# Patient Record
Sex: Female | Born: 1960 | Race: White | Hispanic: No | Marital: Married | Smoking: Never smoker
Health system: Southern US, Community
[De-identification: ages and names within clinical notes are randomized; demographics above are authoritative.]

## PROBLEM LIST (undated history)

## (undated) HISTORY — PX: ABDOMINAL HYSTERECTOMY: SHX81

---

## 2005-10-15 ENCOUNTER — Ambulatory Visit: Payer: Self-pay

## 2005-10-27 ENCOUNTER — Ambulatory Visit: Payer: Self-pay | Admitting: Internal Medicine

## 2006-03-13 ENCOUNTER — Emergency Department: Payer: Self-pay | Admitting: Internal Medicine

## 2006-10-13 ENCOUNTER — Ambulatory Visit: Payer: Self-pay | Admitting: Internal Medicine

## 2007-10-12 ENCOUNTER — Emergency Department: Payer: Self-pay | Admitting: Emergency Medicine

## 2007-11-21 ENCOUNTER — Ambulatory Visit: Payer: Self-pay | Admitting: Internal Medicine

## 2008-12-13 ENCOUNTER — Ambulatory Visit: Payer: Self-pay | Admitting: Family Medicine

## 2010-02-06 ENCOUNTER — Ambulatory Visit: Payer: Self-pay | Admitting: Family Medicine

## 2011-02-11 ENCOUNTER — Ambulatory Visit: Payer: Self-pay | Admitting: Family Medicine

## 2012-02-12 ENCOUNTER — Ambulatory Visit: Payer: Self-pay | Admitting: Family Medicine

## 2013-02-28 ENCOUNTER — Ambulatory Visit: Payer: Self-pay

## 2014-04-03 ENCOUNTER — Ambulatory Visit: Payer: Self-pay | Admitting: Family Medicine

## 2014-11-09 ENCOUNTER — Other Ambulatory Visit: Payer: Self-pay | Admitting: Family Medicine

## 2014-11-09 DIAGNOSIS — Z1231 Encounter for screening mammogram for malignant neoplasm of breast: Secondary | ICD-10-CM

## 2015-04-10 ENCOUNTER — Ambulatory Visit: Admission: RE | Admit: 2015-04-10 | Payer: Managed Care, Other (non HMO) | Source: Ambulatory Visit

## 2015-04-10 ENCOUNTER — Ambulatory Visit: Payer: Managed Care, Other (non HMO) | Attending: Family Medicine

## 2015-06-06 ENCOUNTER — Ambulatory Visit
Admission: RE | Admit: 2015-06-06 | Discharge: 2015-06-06 | Disposition: A | Discharge status: Home / Self Care | Payer: Managed Care, Other (non HMO) | Source: Ambulatory Visit | Attending: Family Medicine | Admitting: Family Medicine

## 2015-06-06 DIAGNOSIS — Z1231 Encounter for screening mammogram for malignant neoplasm of breast: Secondary | ICD-10-CM

## 2016-07-01 ENCOUNTER — Other Ambulatory Visit: Payer: Self-pay | Admitting: Family Medicine

## 2016-07-01 DIAGNOSIS — Z1231 Encounter for screening mammogram for malignant neoplasm of breast: Secondary | ICD-10-CM

## 2016-07-10 ENCOUNTER — Ambulatory Visit
Admission: RE | Admit: 2016-07-10 | Discharge: 2016-07-10 | Disposition: A | Discharge status: Home / Self Care | Payer: Managed Care, Other (non HMO) | Source: Ambulatory Visit | Attending: Family Medicine | Admitting: Family Medicine

## 2016-07-10 DIAGNOSIS — Z1231 Encounter for screening mammogram for malignant neoplasm of breast: Secondary | ICD-10-CM | POA: Diagnosis not present

## 2017-06-28 ENCOUNTER — Other Ambulatory Visit: Payer: Self-pay | Admitting: Family Medicine

## 2017-06-28 DIAGNOSIS — Z1231 Encounter for screening mammogram for malignant neoplasm of breast: Secondary | ICD-10-CM

## 2017-07-12 ENCOUNTER — Ambulatory Visit
Admission: RE | Admit: 2017-07-12 | Discharge: 2017-07-12 | Disposition: A | Discharge status: Home / Self Care | Payer: Managed Care, Other (non HMO) | Source: Ambulatory Visit | Attending: Family Medicine | Admitting: Family Medicine

## 2017-07-12 ENCOUNTER — Encounter (INDEPENDENT_AMBULATORY_CARE_PROVIDER_SITE_OTHER): Payer: Self-pay

## 2017-07-12 DIAGNOSIS — Z1231 Encounter for screening mammogram for malignant neoplasm of breast: Secondary | ICD-10-CM | POA: Diagnosis not present

## 2017-09-05 ENCOUNTER — Other Ambulatory Visit: Payer: Self-pay

## 2017-09-05 ENCOUNTER — Ambulatory Visit
Admission: EM | Admit: 2017-09-05 | Discharge: 2017-09-05 | Disposition: A | Discharge status: Home / Self Care | Payer: Managed Care, Other (non HMO) | Attending: Family Medicine | Admitting: Family Medicine

## 2017-09-05 ENCOUNTER — Encounter: Payer: Self-pay | Admitting: Gynecology

## 2017-09-05 DIAGNOSIS — T7840XA Allergy, unspecified, initial encounter: Secondary | ICD-10-CM | POA: Diagnosis not present

## 2017-09-05 DIAGNOSIS — L500 Allergic urticaria: Secondary | ICD-10-CM

## 2017-09-05 MED ORDER — EPINEPHRINE 0.3 MG/0.3ML IJ SOAJ: 0.3000 mg | Freq: Once | INTRAMUSCULAR | 0 refills | Status: AC

## 2017-09-05 MED ORDER — DIPHENHYDRAMINE HCL 50 MG PO CAPS
50.0000 mg | ORAL_CAPSULE | Freq: Four times a day (QID) | ORAL | Status: DC | PRN
  Administered 2017-09-05: 50 mg via ORAL

## 2017-09-05 MED ORDER — BESIFLOXACIN HCL 0.6 % OP SUSP: 1.0000 [drp] | Freq: Two times a day (BID) | OPHTHALMIC | 0 refills | Status: DC

## 2017-09-05 NOTE — ED Provider Notes (Signed)
[facility]    CSN: [id number] Arrival date & time: 09/05/17  1125     History   Chief Complaint Chief Complaint  Patient presents with  . Allergic Reaction    HPI Michelle Russell is a 57 y.o. female.   The history is provided by the patient.  Allergic Reaction  Presenting symptoms: itching and rash   Presenting symptoms: no difficulty breathing, no difficulty swallowing, no swelling and no wheezing   Severity:  Mild Duration:  1 hour Prior allergic episodes:  Allergies to medications Context: medications (started using new prescription eye drops several days ago)   Context: not animal exposure, not chemicals, not cosmetics, not dairy/milk products, not eggs, not food allergies, not grass, not insect bite/sting, not jewelry/metal, not new detergents/soaps, not nuts and not poison ivy   Relieved by:  None tried Ineffective treatments:  None tried   History reviewed. No pertinent past medical history.  There are no active problems to display for this patient.   Past Surgical History:  Procedure Laterality Date  . ABDOMINAL HYSTERECTOMY      OB History    No data available       Home Medications    Prior to Admission medications   Medication Sig Start Date End Date Taking? Authorizing Provider  levothyroxine (SYNTHROID, LEVOTHROID) 175 MCG tablet Take 175 mcg by mouth daily before breakfast.   Yes [provider]  EPINEPHrine 0.3 mg/0.3 mL IJ SOAJ injection Inject 0.3 mLs (0.3 mg total) into the muscle once for 1 dose. 09/05/17 09/05/17  Payton Mccallumonty, Janiel Crisostomo, MD    Family History Family History  Problem Relation Age of Onset  . Breast cancer Neg Hx     Social History Social History   Tobacco Use  . Smoking status: Not on file  Substance Use Topics  . Alcohol use: Not on file  . Drug use: Not on file     Allergies   Lidocaine   Review of Systems Review of Systems  HENT: Negative for trouble swallowing.   Respiratory: Negative  for wheezing.   Skin: Positive for itching and rash.     Physical Exam Triage Vital Signs ED Triage Vitals  Enc Vitals Group     BP 09/05/17 1137 (!) 172/86     Pulse Rate 09/05/17 1137 77     Resp 09/05/17 1137 16     Temp 09/05/17 1137 (!) 97.4 F (36.3 C)     Temp Source 09/05/17 1137 Oral     SpO2 09/05/17 1137 100 %     Weight --      Height --      Head Circumference --      Peak Flow --      Pain Score 09/05/17 1138 0     Pain Loc --      Pain Edu? --      Excl. in GC? --    No data found.  Updated Vital Signs BP 121/78 (BP Location: Right Arm)   Pulse 65   Temp 98.2 F (36.8 C) (Oral)   Resp 16   Ht 5\' 8"  (1.727 m)   Wt 230 lb (104.3 kg)   SpO2 97%   BMI 34.97 kg/m   Visual Acuity Right Eye Distance:   Left Eye Distance:   Bilateral Distance:    Right Eye Near:   Left Eye Near:    Bilateral Near:     Physical Exam  Constitutional: She appears well-developed and well-nourished.  No distress.  HENT:  Head: Normocephalic and atraumatic.  Right Ear: External ear normal.  Left Ear: External ear normal.  Nose: No mucosal edema, rhinorrhea, nose lacerations, sinus tenderness, nasal deformity, septal deviation or nasal septal hematoma. No epistaxis.  No foreign bodies. Right sinus exhibits no maxillary sinus tenderness and no frontal sinus tenderness. Left sinus exhibits no maxillary sinus tenderness and no frontal sinus tenderness.  Mouth/Throat: Uvula is midline, oropharynx is clear and moist and mucous membranes are normal. No oropharyngeal exudate, posterior oropharyngeal edema, posterior oropharyngeal erythema or tonsillar abscesses. No tonsillar exudate.  Neck: Normal range of motion. Neck supple. No tracheal deviation present. No thyromegaly present.  Cardiovascular: Normal rate, regular rhythm and normal heart sounds.  Pulmonary/Chest: Effort normal and breath sounds normal. No stridor. No respiratory distress. She has no wheezes. She has no rales.    Lymphadenopathy:    She has no cervical adenopathy.  Skin: Rash (face, upper chest and upper back) noted. Rash is urticarial. She is not diaphoretic. There is erythema.  Nursing note and vitals reviewed.    UC Treatments / Results  Labs (all labs ordered are listed, but only abnormal results are displayed) Labs Reviewed - No data to display  EKG  EKG Interpretation None       Radiology No results found.  Procedures Procedures (including critical care time)  Medications Ordered in UC Medications  diphenhydrAMINE (BENADRYL) capsule 50 mg (50 mg Oral Given 09/05/17 1150)     Initial Impression / Assessment and Plan / UC Course  I have reviewed the triage vital signs and the nursing notes.  Pertinent labs & imaging results that were available during my care of the patient were reviewed by me and considered in my medical decision making (see chart for details).       Final Clinical Impressions(s) / UC Diagnoses   Final diagnoses:  Allergic reaction, initial encounter    ED Discharge Orders        Ordered    Besifloxacin HCl (BESIVANCE) 0.6 % SUSP  2 times daily,   Status:  Discontinued     09/05/17 1216    EPINEPHrine 0.3 mg/0.3 mL IJ SOAJ injection   Once     09/05/17 1241     1. diagnosis reviewed with patient 2. Given Benadryl 50mg  po x 1 with resolution of symptoms 3. rx as per orders above (Besivance not prescribed; entered in error) ; reviewed possible side effects, interactions, risks and benefits  4. Recommend follow up with ophthalmologist and PCP regarding allergic reaction  5. Follow-up prn if symptoms worsen or don't improve  Controlled Substance Prescriptions [place] Controlled Substance Registry consulted? Not Applicable   Payton Mccallum, MD 09/05/17 1255

## 2017-09-05 NOTE — ED Triage Notes (Signed)
Per patient having allergic reaction to her eye drops that she start taking x yesterday. Per patient is schedule for eye surgery on 09/07/2017 and was given these eye drops to take prior to surgery. Pt. Rx. Besivance 0.6 sup, 1 drop bilateral eyes 4 times a day and Ilevro 0.3% 1 drop left eye daily.

## 2018-06-10 ENCOUNTER — Other Ambulatory Visit: Payer: Self-pay | Admitting: Family Medicine

## 2018-06-10 DIAGNOSIS — Z1231 Encounter for screening mammogram for malignant neoplasm of breast: Secondary | ICD-10-CM

## 2018-07-13 ENCOUNTER — Ambulatory Visit
Admission: RE | Admit: 2018-07-13 | Discharge: 2018-07-13 | Disposition: A | Discharge status: Home / Self Care | Payer: Managed Care, Other (non HMO) | Source: Ambulatory Visit | Attending: Family Medicine | Admitting: Family Medicine

## 2018-07-13 ENCOUNTER — Encounter (INDEPENDENT_AMBULATORY_CARE_PROVIDER_SITE_OTHER): Payer: Self-pay

## 2018-07-13 DIAGNOSIS — Z1231 Encounter for screening mammogram for malignant neoplasm of breast: Secondary | ICD-10-CM | POA: Insufficient documentation

## 2018-10-21 ENCOUNTER — Encounter: Payer: Self-pay | Admitting: Emergency Medicine

## 2018-10-21 ENCOUNTER — Other Ambulatory Visit: Payer: Self-pay

## 2018-10-21 ENCOUNTER — Ambulatory Visit
Admission: EM | Admit: 2018-10-21 | Discharge: 2018-10-21 | Disposition: A | Discharge status: Home / Self Care | Payer: Managed Care, Other (non HMO) | Attending: Family Medicine | Admitting: Family Medicine

## 2018-10-21 ENCOUNTER — Ambulatory Visit (INDEPENDENT_AMBULATORY_CARE_PROVIDER_SITE_OTHER): Payer: Managed Care, Other (non HMO)

## 2018-10-21 DIAGNOSIS — R109 Unspecified abdominal pain: Secondary | ICD-10-CM

## 2018-10-21 DIAGNOSIS — R1031 Right lower quadrant pain: Secondary | ICD-10-CM

## 2018-10-21 LAB — COMPREHENSIVE METABOLIC PANEL
ALT: 30 U/L (ref 0–44)
AST: 34 U/L (ref 15–41)
Albumin: 4.6 g/dL (ref 3.5–5.0)
Alkaline Phosphatase: 93 U/L (ref 38–126)
Anion gap: 8 (ref 5–15)
BILIRUBIN TOTAL: 0.7 mg/dL (ref 0.3–1.2)
BUN: 17 mg/dL (ref 6–20)
CALCIUM: 9 mg/dL (ref 8.9–10.3)
CO2: 26 mmol/L (ref 22–32)
CREATININE: 0.87 mg/dL (ref 0.44–1.00)
Chloride: 103 mmol/L (ref 98–111)
GFR calc non Af Amer: >60 mL/min (ref >60)
Glucose, Bld: 103 mg/dL — ABNORMAL HIGH (ref 70–99)
Potassium: 4.1 mmol/L (ref 3.5–5.1)
Sodium: 137 mmol/L (ref 135–145)
Total Protein: 8.1 g/dL (ref 6.5–8.1)

## 2018-10-21 LAB — CBC WITH DIFFERENTIAL/PLATELET
Abs Immature Granulocytes: 0.02 10*3/uL (ref 0.00–0.07)
Basophils Absolute: 0 10*3/uL (ref 0.0–0.1)
Basophils Relative: 1 %
EOS PCT: 2 %
Eosinophils Absolute: 0.1 10*3/uL (ref 0.0–0.5)
HCT: 41.6 % (ref 36.0–46.0)
HEMOGLOBIN: 13.4 g/dL (ref 12.0–15.0)
Immature Granulocytes: 0 %
LYMPHS PCT: 35 %
Lymphs Abs: 1.8 10*3/uL (ref 0.7–4.0)
MCH: 29.4 pg (ref 26.0–34.0)
MCHC: 32.2 g/dL (ref 30.0–36.0)
MCV: 91.2 fL (ref 80.0–100.0)
MONO ABS: 0.4 10*3/uL (ref 0.1–1.0)
Monocytes Relative: 8 %
Neutro Abs: 2.7 10*3/uL (ref 1.7–7.7)
Neutrophils Relative %: 54 %
Platelets: 176 10*3/uL (ref 150–400)
RBC: 4.56 MIL/uL (ref 3.87–5.11)
RDW: 12.8 % (ref 11.5–15.5)
WBC: 5 10*3/uL (ref 4.0–10.5)
nRBC: 0 % (ref 0.0–0.2)

## 2018-10-21 LAB — LIPASE, BLOOD: LIPASE: 35 U/L (ref 11–51)

## 2018-10-21 LAB — URINALYSIS, COMPLETE (UACMP) WITH MICROSCOPIC
BACTERIA UA: NONE SEEN
Bilirubin Urine: NEGATIVE
Glucose, UA: NEGATIVE mg/dL
Ketones, ur: NEGATIVE mg/dL
Leukocytes,Ua: NEGATIVE
Nitrite: NEGATIVE
Protein, ur: NEGATIVE mg/dL
SPECIFIC GRAVITY, URINE: 1.02 (ref 1.005–1.030)
WBC UA: NONE SEEN WBC/hpf (ref 0–5)
pH: 5 (ref 5.0–8.0)

## 2018-10-21 MED ORDER — HYDROCODONE-ACETAMINOPHEN 5-325 MG PO TABS: ORAL_TABLET | ORAL | 0 refills | Status: AC

## 2018-10-21 MED ORDER — KETOROLAC TROMETHAMINE 10 MG PO TABS: 10.0000 mg | ORAL_TABLET | Freq: Three times a day (TID) | ORAL | 0 refills | Status: AC | PRN

## 2018-10-21 MED ORDER — KETOROLAC TROMETHAMINE 60 MG/2ML IM SOLN
60.0000 mg | Freq: Once | INTRAMUSCULAR | Status: AC
  Administered 2018-10-21: 60 mg via INTRAMUSCULAR

## 2018-10-21 MED ORDER — TAMSULOSIN HCL 0.4 MG PO CAPS: 0.4000 mg | ORAL_CAPSULE | Freq: Every day | ORAL | 0 refills | Status: AC

## 2018-10-21 NOTE — Discharge Instructions (Addendum)
Increase water intake

## 2018-10-21 NOTE — ED Provider Notes (Signed)
[facility]    CSN: [id number] Arrival date & time: 10/21/18  2482     History   Chief Complaint Chief Complaint  Patient presents with  . Back Pain    HPI Hanadi Galano is a 58 y.o. female.   58 yo female with a c/o low back pain and spasms for the past week. Denies any fevers, chills, nausea/vomiting, bowel problems.   The history is provided by the patient.  Back Pain    History reviewed. No pertinent past medical history.  There are no active problems to display for this patient.   Past Surgical History:  Procedure Laterality Date  . ABDOMINAL HYSTERECTOMY      OB History   No obstetric history on file.      Home Medications    Prior to Admission medications   Medication Sig Start Date End Date Taking? Authorizing Provider  levothyroxine (SYNTHROID, LEVOTHROID) 175 MCG tablet Take 175 mcg by mouth daily before breakfast.   Yes [provider]  HYDROcodone-acetaminophen (NORCO/VICODIN) 5-325 MG tablet 1-2 tabs po q 6 hours prn 10/21/18   Payton Mccallum, MD  ketorolac (TORADOL) 10 MG tablet Take 1 tablet (10 mg total) by mouth every 8 (eight) hours as needed. 10/21/18   Payton Mccallum, MD  tamsulosin (FLOMAX) 0.4 MG CAPS capsule Take 1 capsule (0.4 mg total) by mouth daily. 10/21/18   Payton Mccallum, MD    Family History Family History  Problem Relation Age of Onset  . Hypertension Father   . Breast cancer Neg Hx     Social History Social History   Tobacco Use  . Smoking status: Never Smoker  . Smokeless tobacco: Never Used  Substance Use Topics  . Alcohol use: Not Currently  . Drug use: Never     Allergies   Adhesive [tape]; Clindamycin/lincomycin; Gentamicin; Levaquin [levofloxacin]; Systane [polyethyl glycol-propyl glycol]; and Lidocaine   Review of Systems Review of Systems  Musculoskeletal: Positive for back pain.     Physical Exam Triage Vital Signs ED Triage Vitals  Enc Vitals Group     BP 10/21/18 0841  (!) 153/92     Pulse Rate 10/21/18 0841 82     Resp 10/21/18 0841 16     Temp 10/21/18 0841 98.1 F (36.7 C)     Temp Source 10/21/18 0841 Oral     SpO2 10/21/18 0841 97 %     Weight 10/21/18 0836 240 lb (108.9 kg)     Height 10/21/18 0836 5\' 8"  (1.727 m)     Head Circumference --      Peak Flow --      Pain Score 10/21/18 0836 10     Pain Loc --      Pain Edu? --      Excl. in GC? --    No data found.  Updated Vital Signs BP (!) 153/92 (BP Location: Left Arm)   Pulse 82   Temp 98.1 F (36.7 C) (Oral)   Resp 16   Ht 5\' 8"  (1.727 m)   Wt 108.9 kg   SpO2 97%   BMI 36.49 kg/m   Visual Acuity Right Eye Distance:   Left Eye Distance:   Bilateral Distance:    Right Eye Near:   Left Eye Near:    Bilateral Near:     Physical Exam Vitals signs and nursing note reviewed.  Constitutional:      General: She is not in acute distress.    Appearance: She is  well-developed. She is not toxic-appearing or diaphoretic.  Abdominal:     General: Bowel sounds are normal. There is no distension.     Palpations: Abdomen is soft. There is no mass.     Tenderness: There is abdominal tenderness. There is right CVA tenderness (mild). There is no left CVA tenderness, guarding or rebound.     Hernia: No hernia is present.  Neurological:     Mental Status: She is alert.      UC Treatments / Results  Labs (all labs ordered are listed, but only abnormal results are displayed) Labs Reviewed  COMPREHENSIVE METABOLIC PANEL - Abnormal; Notable for the following components:      Result Value   Glucose, Bld 103 (*)    All other components within normal limits  URINALYSIS, COMPLETE (UACMP) WITH MICROSCOPIC - Abnormal; Notable for the following components:   Hgb urine dipstick TRACE (*)    All other components within normal limits  LIPASE, BLOOD  CBC WITH DIFFERENTIAL/PLATELET    EKG None  Radiology No results found.  Procedures Procedures (including critical care time)   Medications Ordered in UC Medications  ketorolac (TORADOL) injection 60 mg (60 mg Intramuscular Given 10/21/18 0900)    Initial Impression / Assessment and Plan / UC Course  I have reviewed the triage vital signs and the nursing notes.  Pertinent labs & imaging results that were available during my care of the patient were reviewed by me and considered in my medical decision making (see chart for details).      Final Clinical Impressions(s) / UC Diagnoses   Final diagnoses:  Acute right flank pain     Discharge Instructions     Increase water intake     ED Prescriptions    Medication Sig Dispense Auth. Provider   ketorolac (TORADOL) 10 MG tablet Take 1 tablet (10 mg total) by mouth every 8 (eight) hours as needed. 15 tablet Payton Mccallum, MD   tamsulosin (FLOMAX) 0.4 MG CAPS capsule Take 1 capsule (0.4 mg total) by mouth daily. 7 capsule Payton Mccallum, MD   HYDROcodone-acetaminophen (NORCO/VICODIN) 5-325 MG tablet 1-2 tabs po q 6 hours prn 6 tablet Payton Mccallum, MD     1. Labs/x-ray results and diagnosis reviewed with patient 2. rx as per orders above; reviewed possible side effects, interactions, risks and benefits  3. Recommend supportive treatment as above 4. Follow-up prn if symptoms worsen or don't improve  Controlled Substance Prescriptions [place] Controlled Substance Registry consulted? Not Applicable   Payton Mccallum, MD 11/11/18 1750

## 2018-10-21 NOTE — ED Triage Notes (Signed)
Patient c/o lower back pain and spasms that started a week ago.  Patient reports being constipated.  Patient reports last bowel movement was last night and states stools not hard.  Patient denies N/V.  Patient denies fever or chills.  Patient denies travel outside of [place] in the past 14 days.

## 2020-01-19 ENCOUNTER — Other Ambulatory Visit: Payer: Self-pay | Admitting: Student

## 2020-01-19 DIAGNOSIS — Z1231 Encounter for screening mammogram for malignant neoplasm of breast: Secondary | ICD-10-CM

## 2020-01-24 ENCOUNTER — Other Ambulatory Visit: Payer: Self-pay

## 2020-01-24 ENCOUNTER — Ambulatory Visit
Admission: RE | Admit: 2020-01-24 | Discharge: 2020-01-24 | Disposition: A | Discharge status: Home / Self Care | Payer: Managed Care, Other (non HMO) | Source: Ambulatory Visit | Attending: Student | Admitting: Student

## 2020-01-24 DIAGNOSIS — Z1231 Encounter for screening mammogram for malignant neoplasm of breast: Secondary | ICD-10-CM | POA: Diagnosis not present

## 2020-02-15 IMAGING — CR ABDOMEN - 2 VIEW
3 series · 3 of 3 positions shown · non-contrast
Comparison: None.

CLINICAL DATA: Abdominal pain

EXAM:
ABDOMEN - 2 VIEW

[abdomen erect]
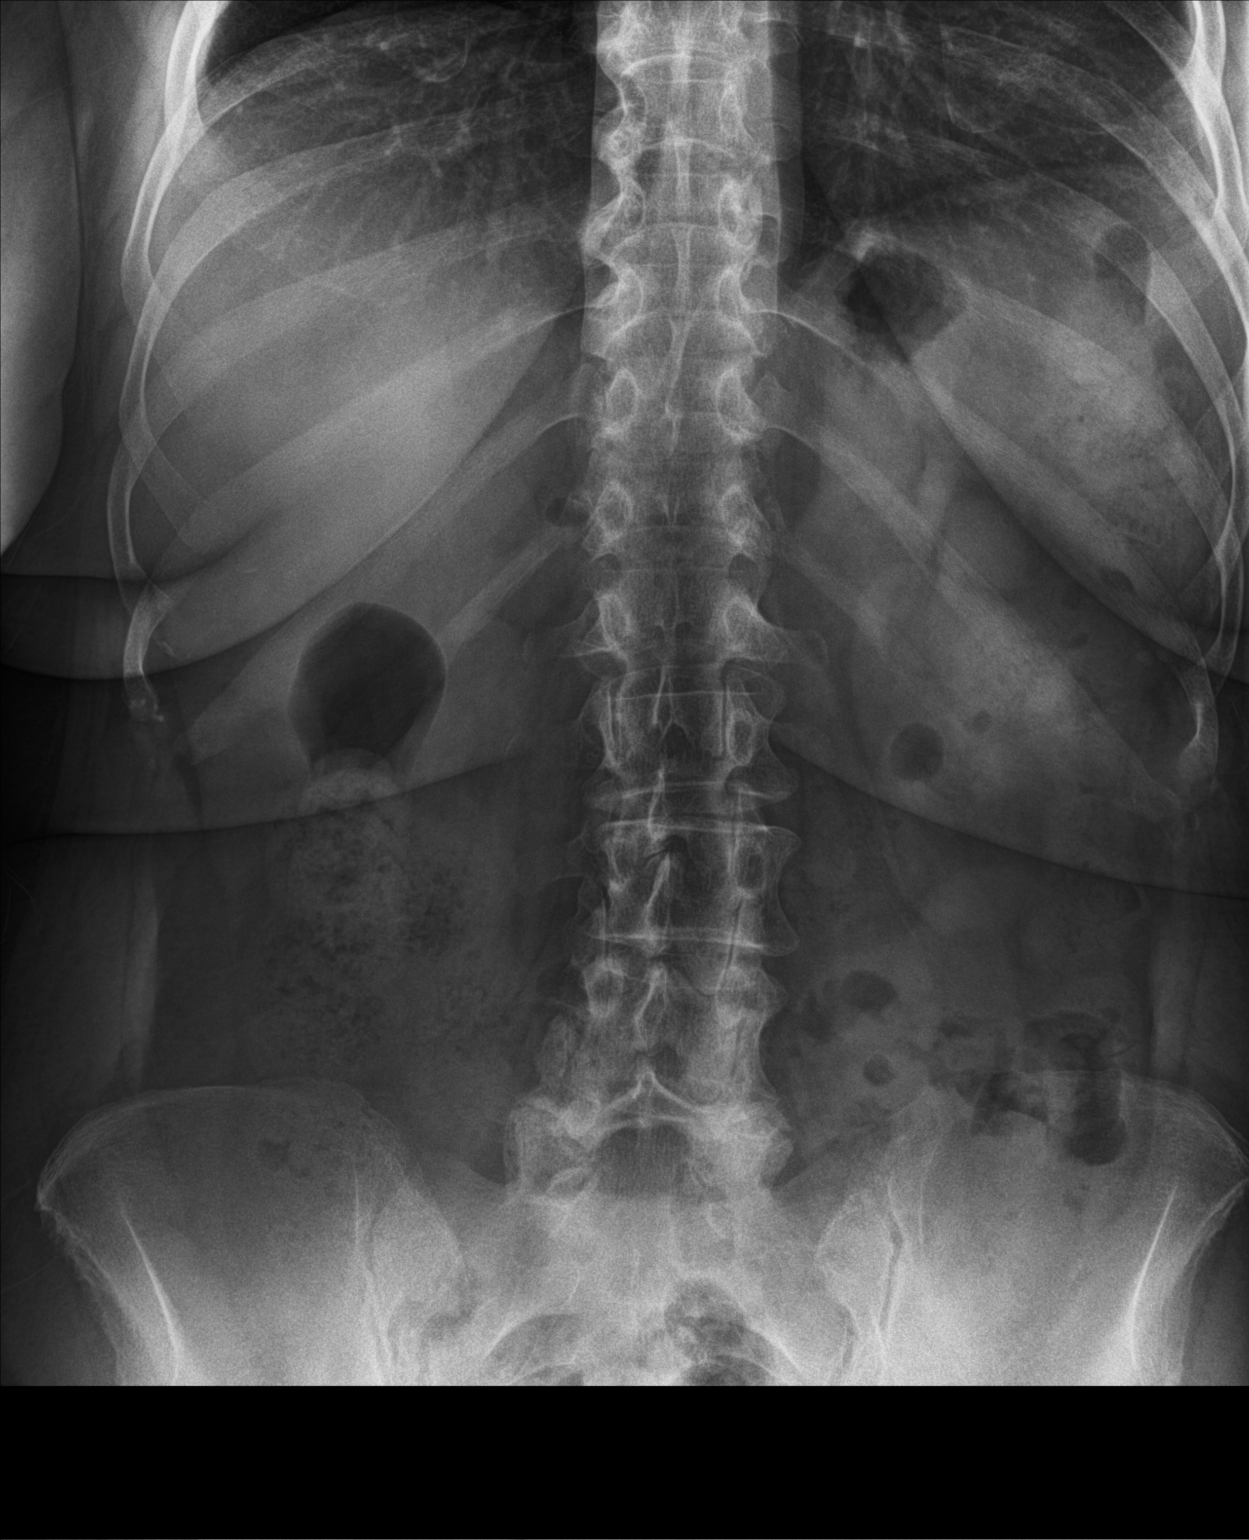

[abdomen supine (1 of 2)]
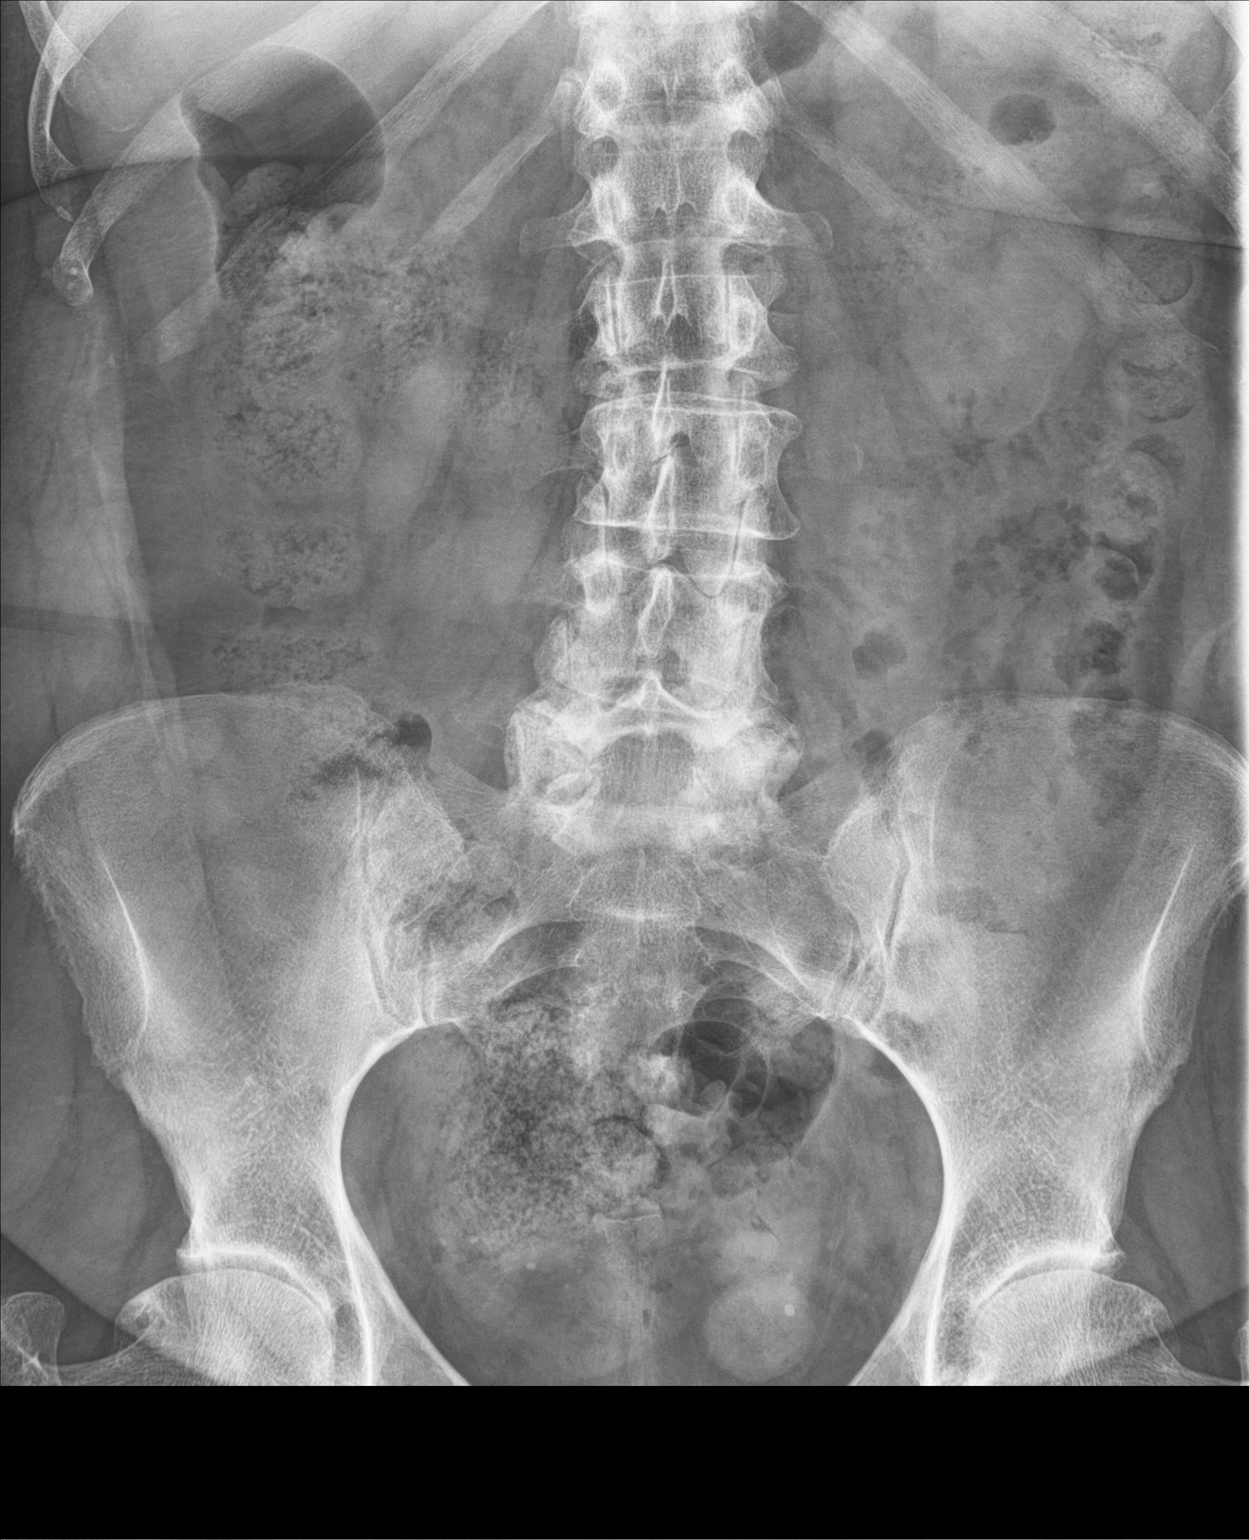

[abdomen supine (2 of 2)]
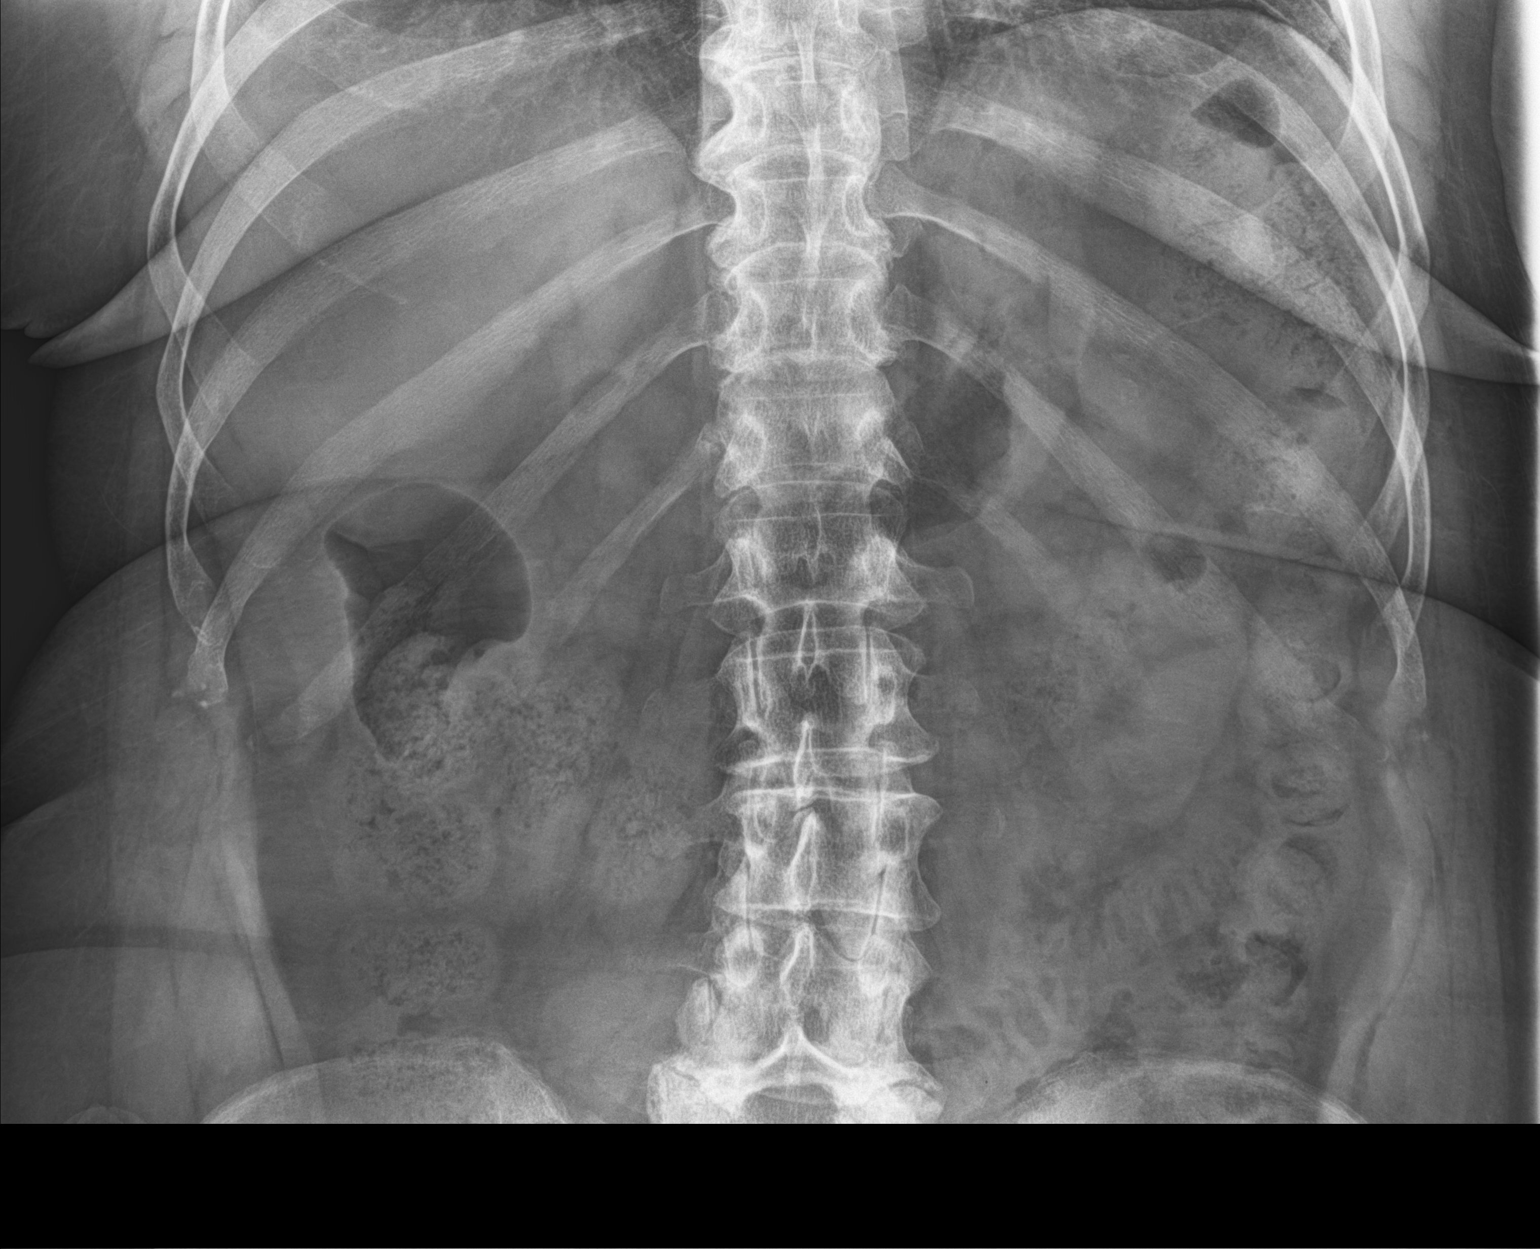

[3 of 3 positions shown; findings below may reference images not displayed]

FINDINGS: Supine and upright images obtained. There is stool throughout much
of the colon. There is no bowel dilatation or air-fluid level to
suggest bowel obstruction. No free air. Lung bases are clear. There
are small probable phleboliths in the pelvis.
IMPRESSION: Fairly diffuse stool throughout colon. Question a degree of
underlying constipation. No bowel obstruction or free air. Lung
bases clear.

## 2021-03-11 ENCOUNTER — Other Ambulatory Visit: Payer: Self-pay | Admitting: Student

## 2021-03-11 DIAGNOSIS — Z1231 Encounter for screening mammogram for malignant neoplasm of breast: Secondary | ICD-10-CM

## 2021-04-01 ENCOUNTER — Other Ambulatory Visit: Payer: Self-pay

## 2021-04-01 ENCOUNTER — Ambulatory Visit
Admission: RE | Admit: 2021-04-01 | Discharge: 2021-04-01 | Disposition: A | Discharge status: Home / Self Care | Payer: Commercial Managed Care - PPO | Source: Ambulatory Visit | Attending: Student | Admitting: Student

## 2021-04-01 DIAGNOSIS — Z1231 Encounter for screening mammogram for malignant neoplasm of breast: Secondary | ICD-10-CM | POA: Diagnosis present

## 2021-05-20 IMAGING — MG DIGITAL SCREENING BILAT W/ TOMO W/ CAD
8 series · 8 of 24 positions shown · non-contrast
Comparison: Previous exam(s).

CLINICAL DATA: Screening.

EXAM:
DIGITAL SCREENING BILATERAL MAMMOGRAM WITH TOMO AND CAD

[L CC synth-2D]
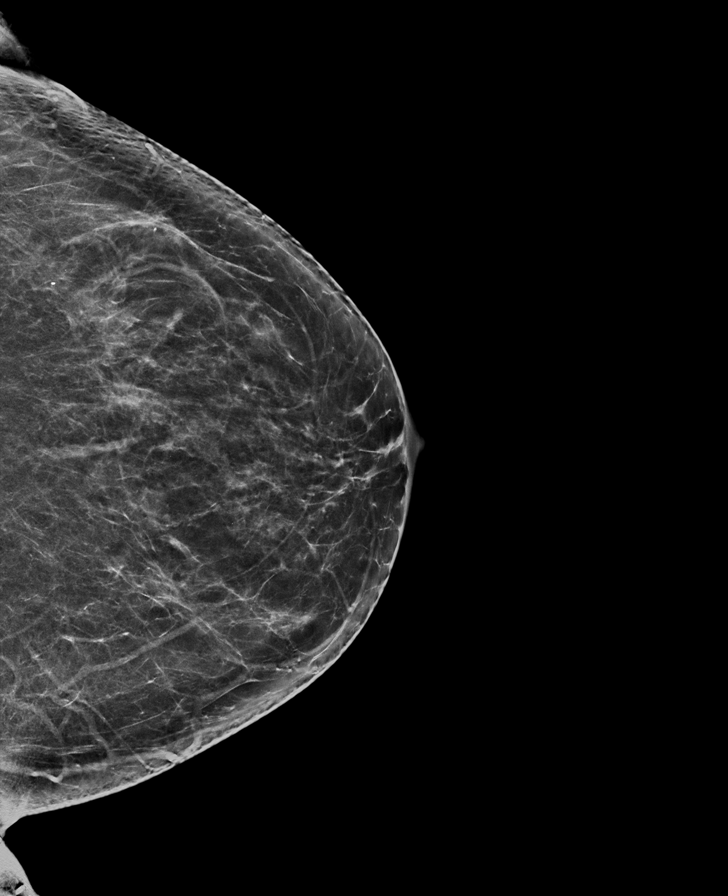

[R CC synth-2D]
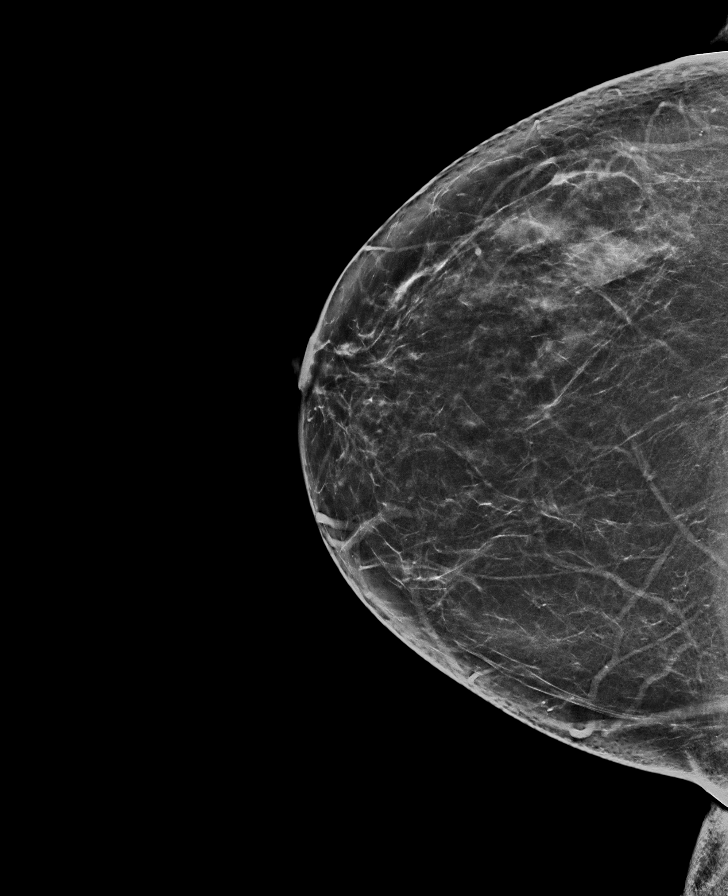

[R MLO synth-2D]
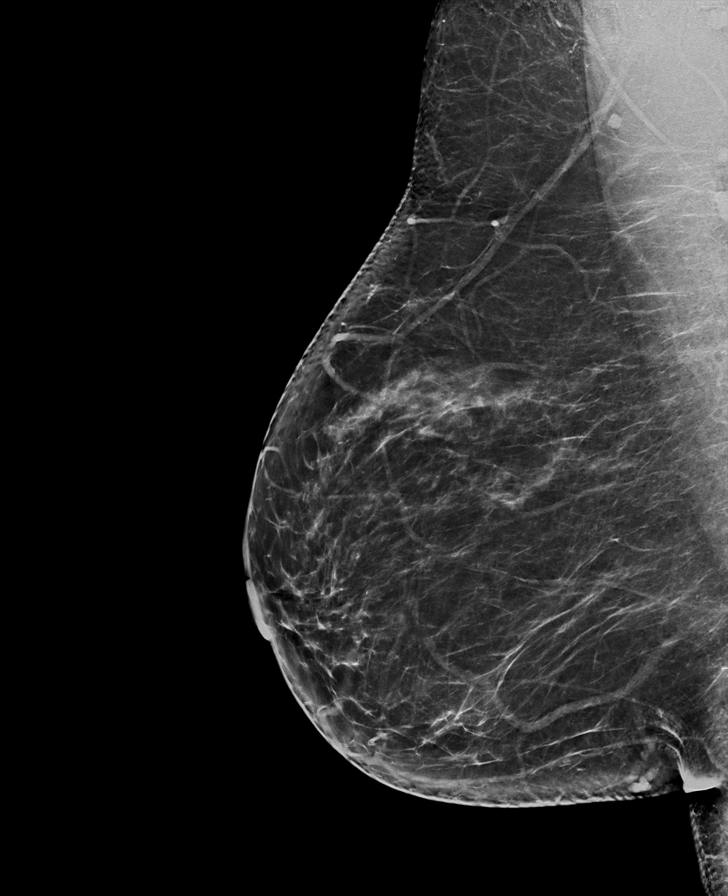

[L MLO synth-2D]
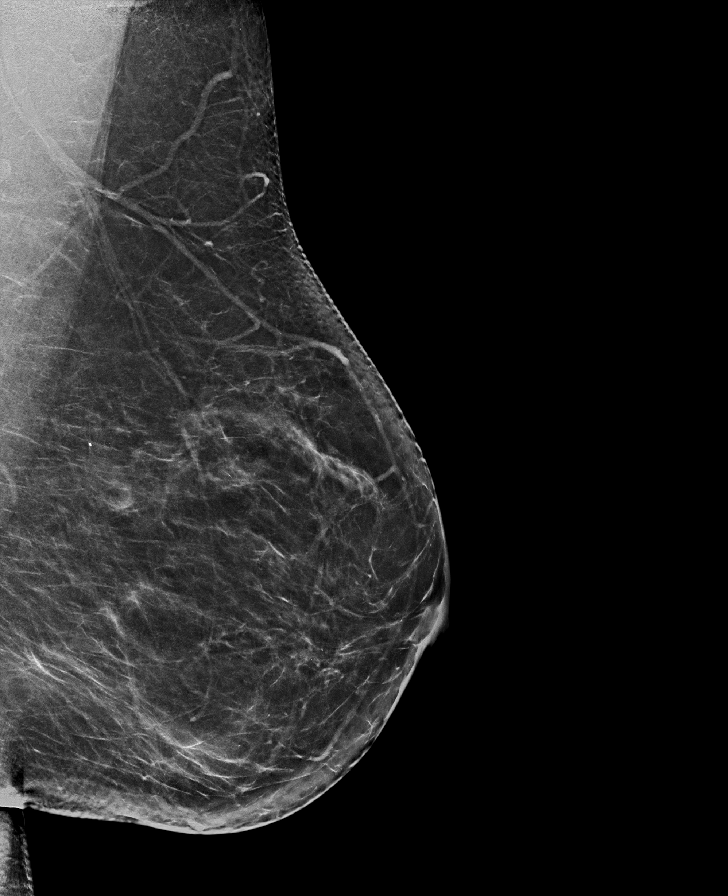

[L CC tomo · tomo slice 38/75.0]
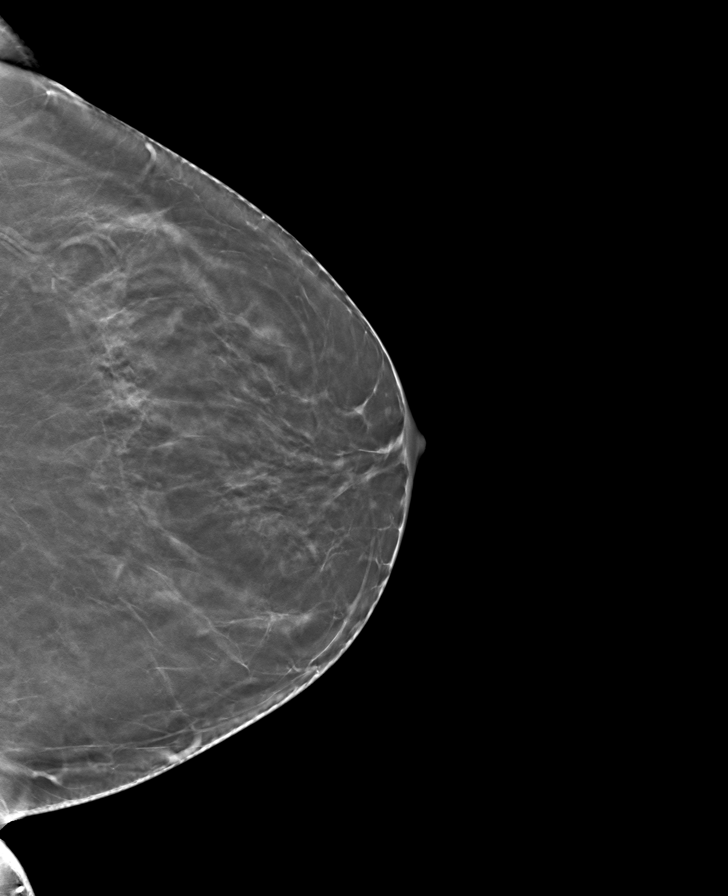

[L MLO tomo · tomo slice 41/81.0]
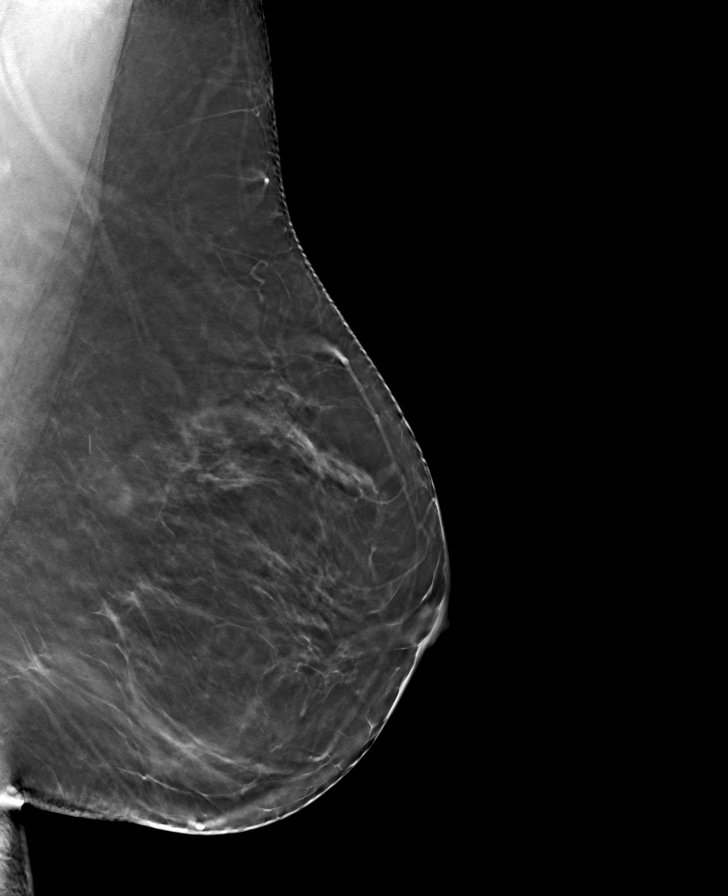

[R MLO tomo · tomo slice 43/84.0]
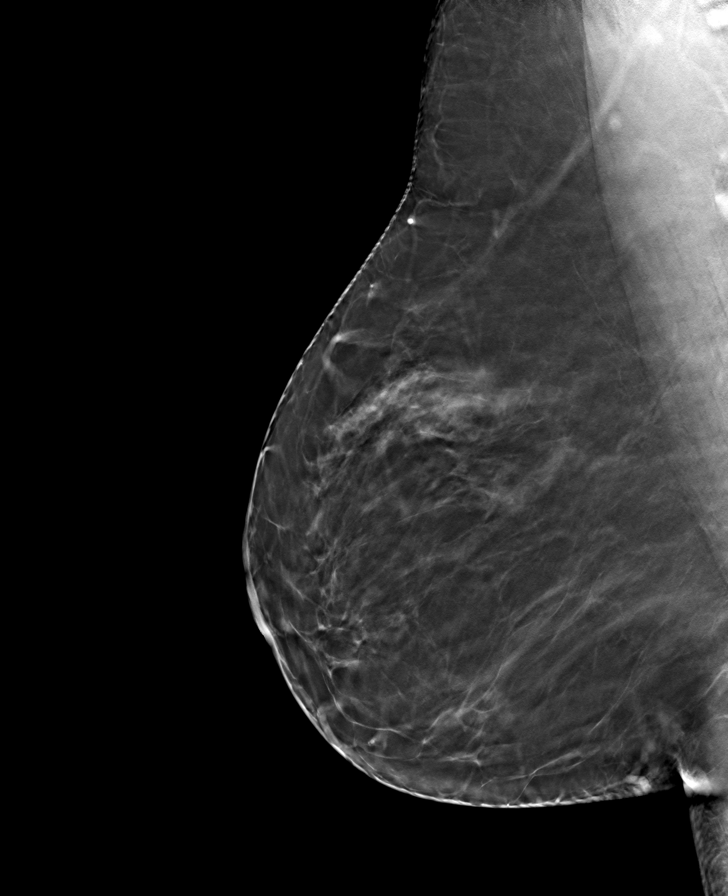

[R CC tomo · tomo slice 39/77.0]
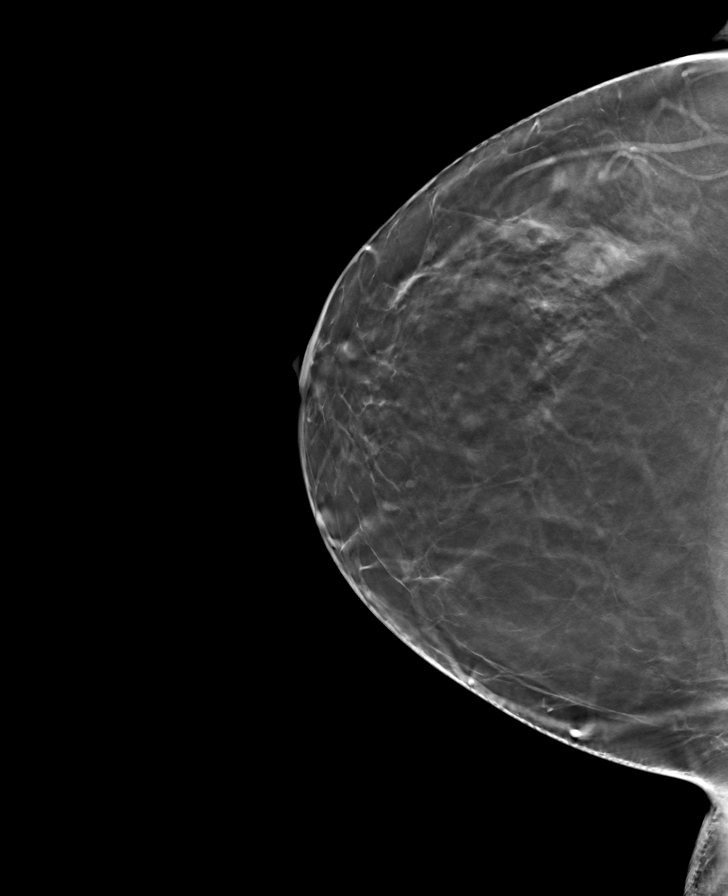

[8 of 24 positions shown; findings below may reference images not displayed]

ACR Breast Density Category b: There are scattered areas of
fibroglandular density.
FINDINGS: There are no findings suspicious for malignancy. Images were
processed with CAD.
IMPRESSION: No mammographic evidence of malignancy. A result letter of this
screening mammogram will be mailed directly to the patient.

RECOMMENDATION:
Screening mammogram in one year. (Code:CN-U-775)

BI-RADS CATEGORY  1: Negative.

## 2022-05-04 ENCOUNTER — Other Ambulatory Visit: Payer: Self-pay | Admitting: Student

## 2022-05-04 DIAGNOSIS — Z1231 Encounter for screening mammogram for malignant neoplasm of breast: Secondary | ICD-10-CM

## 2022-06-01 ENCOUNTER — Ambulatory Visit
Admission: RE | Admit: 2022-06-01 | Discharge: 2022-06-01 | Disposition: A | Discharge status: Home / Self Care | Payer: 59 | Source: Ambulatory Visit | Attending: Student | Admitting: Student

## 2022-06-01 DIAGNOSIS — Z1231 Encounter for screening mammogram for malignant neoplasm of breast: Secondary | ICD-10-CM | POA: Insufficient documentation

## 2022-11-05 ENCOUNTER — Ambulatory Visit
Admission: EM | Admit: 2022-11-05 | Discharge: 2022-11-05 | Disposition: A | Discharge status: Home / Self Care | Payer: 59 | Attending: Internal Medicine | Admitting: Internal Medicine

## 2022-11-05 DIAGNOSIS — L551 Sunburn of second degree: Secondary | ICD-10-CM | POA: Diagnosis not present

## 2022-11-05 MED ORDER — NYSTATIN 100000 UNIT/ML MT SUSP: 15.0000 mL | Freq: Four times a day (QID) | OROMUCOSAL | 0 refills | Status: AC | PRN

## 2022-11-05 NOTE — ED Triage Notes (Signed)
Pt presents to UC c/o burn on lips, pt [place] the [place] on [place], pt states she noticed swelling Saturday.

## 2022-11-05 NOTE — ED Provider Notes (Signed)
[facility]    CSN: [id number] Arrival date & time: 11/05/22  1654      History   Chief Complaint Chief Complaint  Patient presents with   Sunburn    Lips    HPI Michelle Russell is a 62 y.o. female comes to urgent care with lip swelling and oral pain.  Patient suffered sunburn to her lips about a week ago.  A couple of days after a sunburn she noticed significant swelling of her lips with associated pain.  She has tried aloe vera, Vaseline, hydrocortisone cream with no improvement in his symptoms.  Over the course of the day she developed blisters on her lips and in her mouth.  Swelling has improved over the past couple of days.  Currently she is experiencing more pain in her mouth.  No fever or chills.  She is using diluted hydrogen peroxide as mouth rinse. HPI  History reviewed. No pertinent past medical history.  There are no problems to display for this patient.   Past Surgical History:  Procedure Laterality Date   ABDOMINAL HYSTERECTOMY      OB History   No obstetric history on file.      Home Medications    Prior to Admission medications   Medication Sig Start Date End Date Taking? Authorizing Provider  magic mouthwash (nystatin, hydrocortisone, diphenhydrAMINE) suspension Swish and spit 15 mLs 4 (four) times daily as needed for mouth pain. 11/05/22  Yes Argie Applegate, Britta Mccreedy, MD  HYDROcodone-acetaminophen (NORCO/VICODIN) 5-325 MG tablet 1-2 tabs po q 6 hours prn 10/21/18   Payton Mccallum, MD  ketorolac (TORADOL) 10 MG tablet Take 1 tablet (10 mg total) by mouth every 8 (eight) hours as needed. 10/21/18   Payton Mccallum, MD  levothyroxine (SYNTHROID, LEVOTHROID) 175 MCG tablet Take 175 mcg by mouth daily before breakfast.    [provider]  tamsulosin (FLOMAX) 0.4 MG CAPS capsule Take 1 capsule (0.4 mg total) by mouth daily. 10/21/18   Payton Mccallum, MD    Family History Family History  Problem Relation Age of Onset   Hypertension Father     Breast cancer Sister 69   Breast cancer Paternal Aunt    Breast cancer Paternal Aunt    Breast cancer Paternal Grandmother    Breast cancer Cousin     Social History Social History   Tobacco Use   Smoking status: Never   Smokeless tobacco: Never  Vaping Use   Vaping Use: Never used  Substance Use Topics   Alcohol use: Not Currently   Drug use: Never     Allergies   Adhesive [tape], Clindamycin/lincomycin, Gentamicin, Levaquin [levofloxacin], Silicone, Systane [polyethyl glycol-propyl glycol], and Lidocaine   Review of Systems Review of Systems As per HPI  Physical Exam Triage Vital Signs ED Triage Vitals [11/05/22 1716]  Enc Vitals Group     BP (!) 150/76     Pulse Rate 72     Resp      Temp 97.9 F (36.6 C)     Temp Source Oral     SpO2 96 %     Weight 250 lb (113.4 kg)     Height 5\' 8"  (1.727 m)     Head Circumference      Peak Flow      Pain Score      Pain Loc      Pain Edu?      Excl. in GC?    No data found.  Updated Vital Signs BP Marland Kitchen)  150/76 (BP Location: Left Arm)   Pulse 72   Temp 97.9 F (36.6 C) (Oral)   Ht 5\' 8"  (1.727 m)   Wt 113.4 kg   SpO2 96%   BMI 38.01 kg/m   Visual Acuity Right Eye Distance:   Left Eye Distance:   Bilateral Distance:    Right Eye Near:   Left Eye Near:    Bilateral Near:     Physical Exam Vitals and nursing note reviewed.  Constitutional:      General: She is in acute distress.     Appearance: She is not ill-appearing.  HENT:     Mouth/Throat:     Mouth: Mucous membranes are moist.     Comments: Lip swelling with mild erythema over both lips.  Few blisters on both lips.  Mucosal surface of the lips are erythematous with some maceration of the mucosal surface of the lips. Cardiovascular:     Rate and Rhythm: Normal rate and regular rhythm.  Neurological:     Mental Status: She is alert.      UC Treatments / Results  Labs (all labs ordered are listed, but only abnormal results are  displayed) Labs Reviewed - No data to display  EKG   Radiology No results found.  Procedures Procedures (including critical care time)  Medications Ordered in UC Medications - No data to display  Initial Impression / Assessment and Plan / UC Course  I have reviewed the triage vital signs and the nursing notes.  Pertinent labs & imaging results that were available during my care of the patient were reviewed by me and considered in my medical decision making (see chart for details).     1.  Sunburn with lip swelling: Patient is advised to stop using hydrogen peroxide Mouthwash 4 times a day Tylenol or ibuprofen as needed for pain Patient is advised to continue using aloe vera on her lips If patient plans to be outdoors, she is advised to use sunscreen with SPF of at least 30 Return to urgent care to be reevaluated if symptoms worsen. Final Clinical Impressions(s) / UC Diagnoses   Final diagnoses:  Sunburn, blistering     Discharge Instructions      Please take Tylenol or ibuprofen as needed for pain Use mouthwash up to 4 times a day as needed Continue to use aloe vera lotion or lip balm in the areas involved Please buy sunscreen with the highest SPF(at least 30 or higher) if you are going to do outdoor activities. If you have worsening symptoms please return to urgent care to be reevaluated.   ED Prescriptions     Medication Sig Dispense Auth. Provider   magic mouthwash (nystatin, hydrocortisone, diphenhydrAMINE) suspension Swish and spit 15 mLs 4 (four) times daily as needed for mouth pain. 540 mL Kenzee Bassin, Britta Mccreedy, MD      PDMP not reviewed this encounter.   Merrilee Jansky, MD 11/05/22 [phone]

## 2022-11-05 NOTE — Discharge Instructions (Addendum)
Please take Tylenol or ibuprofen as needed for pain Use mouthwash up to 4 times a day as needed Continue to use aloe vera lotion or lip balm in the areas involved Please buy sunscreen with the highest SPF(at least 30 or higher) if you are going to do outdoor activities. If you have worsening symptoms please return to urgent care to be reevaluated.

## 2022-12-29 ENCOUNTER — Other Ambulatory Visit: Payer: Self-pay | Admitting: Student

## 2022-12-29 DIAGNOSIS — Z1231 Encounter for screening mammogram for malignant neoplasm of breast: Secondary | ICD-10-CM

## 2023-05-19 ENCOUNTER — Encounter (INDEPENDENT_AMBULATORY_CARE_PROVIDER_SITE_OTHER): Payer: 59 | Admitting: Ophthalmology

## 2023-05-24 ENCOUNTER — Encounter (INDEPENDENT_AMBULATORY_CARE_PROVIDER_SITE_OTHER): Payer: 59 | Admitting: Ophthalmology

## 2023-05-24 DIAGNOSIS — D3132 Benign neoplasm of left choroid: Secondary | ICD-10-CM | POA: Diagnosis not present

## 2023-05-24 DIAGNOSIS — H43813 Vitreous degeneration, bilateral: Secondary | ICD-10-CM

## 2023-05-24 DIAGNOSIS — H35373 Puckering of macula, bilateral: Secondary | ICD-10-CM | POA: Diagnosis not present

## 2023-06-10 ENCOUNTER — Ambulatory Visit
Admission: RE | Admit: 2023-06-10 | Discharge: 2023-06-10 | Disposition: A | Discharge status: Home / Self Care | Payer: 59 | Source: Ambulatory Visit | Attending: Student | Admitting: Student

## 2023-06-10 DIAGNOSIS — Z1231 Encounter for screening mammogram for malignant neoplasm of breast: Secondary | ICD-10-CM | POA: Diagnosis present

## 2023-11-22 ENCOUNTER — Encounter (INDEPENDENT_AMBULATORY_CARE_PROVIDER_SITE_OTHER): Payer: 59 | Admitting: Ophthalmology

## 2023-12-24 ENCOUNTER — Encounter (INDEPENDENT_AMBULATORY_CARE_PROVIDER_SITE_OTHER): Admitting: Ophthalmology

## 2023-12-24 DIAGNOSIS — H35373 Puckering of macula, bilateral: Secondary | ICD-10-CM

## 2023-12-24 DIAGNOSIS — H43813 Vitreous degeneration, bilateral: Secondary | ICD-10-CM | POA: Diagnosis not present

## 2023-12-24 DIAGNOSIS — D3132 Benign neoplasm of left choroid: Secondary | ICD-10-CM | POA: Diagnosis not present

## 2023-12-24 DIAGNOSIS — H35342 Macular cyst, hole, or pseudohole, left eye: Secondary | ICD-10-CM

## 2024-05-01 ENCOUNTER — Other Ambulatory Visit: Payer: Self-pay | Admitting: Student

## 2024-05-01 DIAGNOSIS — Z1231 Encounter for screening mammogram for malignant neoplasm of breast: Secondary | ICD-10-CM

## 2024-06-20 ENCOUNTER — Ambulatory Visit
Admission: RE | Admit: 2024-06-20 | Discharge: 2024-06-20 | Disposition: A | Discharge status: Home / Self Care | Source: Ambulatory Visit | Attending: Student | Admitting: Student

## 2024-06-20 DIAGNOSIS — Z1231 Encounter for screening mammogram for malignant neoplasm of breast: Secondary | ICD-10-CM | POA: Diagnosis present

## 2024-06-30 ENCOUNTER — Encounter (INDEPENDENT_AMBULATORY_CARE_PROVIDER_SITE_OTHER): Admitting: Ophthalmology
